# Patient Record
Sex: Female | Born: 1985 | Race: Black or African American | Hispanic: No | Marital: Single | Smoking: Never smoker
Health system: Southern US, Community
[De-identification: ages and names within clinical notes are randomized; demographics above are authoritative.]

---

## 2014-07-08 ENCOUNTER — Emergency Department (HOSPITAL_BASED_OUTPATIENT_CLINIC_OR_DEPARTMENT_OTHER): Payer: Self-pay

## 2014-07-08 ENCOUNTER — Encounter (HOSPITAL_BASED_OUTPATIENT_CLINIC_OR_DEPARTMENT_OTHER): Payer: Self-pay

## 2014-07-08 ENCOUNTER — Emergency Department (HOSPITAL_BASED_OUTPATIENT_CLINIC_OR_DEPARTMENT_OTHER)
Admission: EM | Admit: 2014-07-08 | Discharge: 2014-07-08 | Disposition: A | Discharge status: Home / Self Care | Payer: Self-pay | Attending: Emergency Medicine | Admitting: Emergency Medicine

## 2014-07-08 DIAGNOSIS — Y92410 Unspecified street and highway as the place of occurrence of the external cause: Secondary | ICD-10-CM | POA: Insufficient documentation

## 2014-07-08 DIAGNOSIS — Y998 Other external cause status: Secondary | ICD-10-CM | POA: Insufficient documentation

## 2014-07-08 DIAGNOSIS — S2231XA Fracture of one rib, right side, initial encounter for closed fracture: Secondary | ICD-10-CM | POA: Insufficient documentation

## 2014-07-08 DIAGNOSIS — Y9389 Activity, other specified: Secondary | ICD-10-CM | POA: Insufficient documentation

## 2014-07-08 MED ORDER — HYDROCODONE-ACETAMINOPHEN 7.5-325 MG/15ML PO SOLN: 15.0000 mL | ORAL | Status: AC | PRN

## 2014-07-08 MED ORDER — IBUPROFEN 100 MG/5ML PO SUSP
400.0000 mg | Freq: Once | ORAL | Status: AC
  Administered 2014-07-08: 400 mg via ORAL
  Filled 2014-07-08: qty 20

## 2014-07-08 MED ORDER — METHOCARBAMOL 500 MG PO TABS: 500.0000 mg | ORAL_TABLET | Freq: Two times a day (BID) | ORAL | Status: AC

## 2014-07-08 MED ORDER — HYDROCODONE-ACETAMINOPHEN 7.5-325 MG/15ML PO SOLN
10.0000 mL | Freq: Once | ORAL | Status: AC
  Administered 2014-07-08: 10 mL via ORAL
  Filled 2014-07-08: qty 15

## 2014-07-08 MED ORDER — IBUPROFEN 100 MG PO CHEW
400.0000 mg | CHEWABLE_TABLET | Freq: Once | ORAL | Status: DC
  Filled 2014-07-08: qty 4
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
